# Patient Record
Sex: Female | Born: 1960 | Hispanic: No | Marital: Married | Smoking: Never smoker
Health system: Southern US, Community
[De-identification: ages and names within clinical notes are randomized; demographics above are authoritative.]

## PROBLEM LIST (undated history)

## (undated) DIAGNOSIS — M1990 Unspecified osteoarthritis, unspecified site: Secondary | ICD-10-CM

## (undated) DIAGNOSIS — G709 Myoneural disorder, unspecified: Secondary | ICD-10-CM

## (undated) HISTORY — PX: CHOLECYSTECTOMY: SHX55

## (undated) HISTORY — PX: COLONOSCOPY: SHX174

## (undated) HISTORY — DX: Unspecified osteoarthritis, unspecified site: M19.90

## (undated) HISTORY — PX: TUBAL LIGATION: SHX77

## (undated) HISTORY — DX: Myoneural disorder, unspecified: G70.9

---

## 2015-09-28 DIAGNOSIS — G8929 Other chronic pain: Secondary | ICD-10-CM | POA: Diagnosis not present

## 2015-09-28 DIAGNOSIS — M791 Myalgia: Secondary | ICD-10-CM | POA: Diagnosis not present

## 2015-12-22 DIAGNOSIS — Z0000 Encounter for general adult medical examination without abnormal findings: Secondary | ICD-10-CM | POA: Diagnosis not present

## 2015-12-22 DIAGNOSIS — M7552 Bursitis of left shoulder: Secondary | ICD-10-CM | POA: Diagnosis not present

## 2016-04-18 DIAGNOSIS — D225 Melanocytic nevi of trunk: Secondary | ICD-10-CM | POA: Diagnosis not present

## 2016-04-18 DIAGNOSIS — L570 Actinic keratosis: Secondary | ICD-10-CM | POA: Diagnosis not present

## 2016-04-18 DIAGNOSIS — Z1231 Encounter for screening mammogram for malignant neoplasm of breast: Secondary | ICD-10-CM | POA: Diagnosis not present

## 2016-04-18 DIAGNOSIS — L821 Other seborrheic keratosis: Secondary | ICD-10-CM | POA: Diagnosis not present

## 2016-04-18 DIAGNOSIS — C44612 Basal cell carcinoma of skin of right upper limb, including shoulder: Secondary | ICD-10-CM | POA: Diagnosis not present

## 2016-04-18 DIAGNOSIS — L820 Inflamed seborrheic keratosis: Secondary | ICD-10-CM | POA: Diagnosis not present

## 2016-04-18 DIAGNOSIS — D2239 Melanocytic nevi of other parts of face: Secondary | ICD-10-CM | POA: Diagnosis not present

## 2016-06-10 DIAGNOSIS — J329 Chronic sinusitis, unspecified: Secondary | ICD-10-CM | POA: Diagnosis not present

## 2016-06-10 DIAGNOSIS — Z20828 Contact with and (suspected) exposure to other viral communicable diseases: Secondary | ICD-10-CM | POA: Diagnosis not present

## 2016-06-10 DIAGNOSIS — J40 Bronchitis, not specified as acute or chronic: Secondary | ICD-10-CM | POA: Diagnosis not present

## 2016-06-10 DIAGNOSIS — M791 Myalgia: Secondary | ICD-10-CM | POA: Diagnosis not present

## 2016-07-18 DIAGNOSIS — J40 Bronchitis, not specified as acute or chronic: Secondary | ICD-10-CM | POA: Diagnosis not present

## 2016-07-18 DIAGNOSIS — M150 Primary generalized (osteo)arthritis: Secondary | ICD-10-CM | POA: Diagnosis not present

## 2016-07-18 DIAGNOSIS — Z6830 Body mass index (BMI) 30.0-30.9, adult: Secondary | ICD-10-CM | POA: Diagnosis not present

## 2016-07-18 DIAGNOSIS — M797 Fibromyalgia: Secondary | ICD-10-CM | POA: Diagnosis not present

## 2016-07-18 DIAGNOSIS — J329 Chronic sinusitis, unspecified: Secondary | ICD-10-CM | POA: Diagnosis not present

## 2016-07-18 DIAGNOSIS — M7989 Other specified soft tissue disorders: Secondary | ICD-10-CM | POA: Diagnosis not present

## 2016-08-08 DIAGNOSIS — M797 Fibromyalgia: Secondary | ICD-10-CM | POA: Diagnosis not present

## 2016-08-08 DIAGNOSIS — M150 Primary generalized (osteo)arthritis: Secondary | ICD-10-CM | POA: Diagnosis not present

## 2016-08-08 DIAGNOSIS — M7989 Other specified soft tissue disorders: Secondary | ICD-10-CM | POA: Diagnosis not present

## 2016-10-24 DIAGNOSIS — H81399 Other peripheral vertigo, unspecified ear: Secondary | ICD-10-CM | POA: Diagnosis not present

## 2016-12-06 DIAGNOSIS — Z01419 Encounter for gynecological examination (general) (routine) without abnormal findings: Secondary | ICD-10-CM | POA: Diagnosis not present

## 2016-12-06 DIAGNOSIS — Z1389 Encounter for screening for other disorder: Secondary | ICD-10-CM | POA: Diagnosis not present

## 2016-12-06 DIAGNOSIS — Z6831 Body mass index (BMI) 31.0-31.9, adult: Secondary | ICD-10-CM | POA: Diagnosis not present

## 2017-05-27 DIAGNOSIS — J40 Bronchitis, not specified as acute or chronic: Secondary | ICD-10-CM | POA: Diagnosis not present

## 2017-05-27 DIAGNOSIS — Z6833 Body mass index (BMI) 33.0-33.9, adult: Secondary | ICD-10-CM | POA: Diagnosis not present

## 2017-05-27 DIAGNOSIS — J329 Chronic sinusitis, unspecified: Secondary | ICD-10-CM | POA: Diagnosis not present

## 2017-11-06 DIAGNOSIS — L821 Other seborrheic keratosis: Secondary | ICD-10-CM | POA: Diagnosis not present

## 2017-11-06 DIAGNOSIS — Z0001 Encounter for general adult medical examination with abnormal findings: Secondary | ICD-10-CM | POA: Diagnosis not present

## 2017-11-06 DIAGNOSIS — L728 Other follicular cysts of the skin and subcutaneous tissue: Secondary | ICD-10-CM | POA: Diagnosis not present

## 2017-11-06 DIAGNOSIS — D225 Melanocytic nevi of trunk: Secondary | ICD-10-CM | POA: Diagnosis not present

## 2017-11-06 DIAGNOSIS — M797 Fibromyalgia: Secondary | ICD-10-CM | POA: Diagnosis not present

## 2017-11-06 DIAGNOSIS — D2239 Melanocytic nevi of other parts of face: Secondary | ICD-10-CM | POA: Diagnosis not present

## 2017-11-06 DIAGNOSIS — Z1339 Encounter for screening examination for other mental health and behavioral disorders: Secondary | ICD-10-CM | POA: Diagnosis not present

## 2017-11-06 DIAGNOSIS — Z1231 Encounter for screening mammogram for malignant neoplasm of breast: Secondary | ICD-10-CM | POA: Diagnosis not present

## 2017-11-06 DIAGNOSIS — L820 Inflamed seborrheic keratosis: Secondary | ICD-10-CM | POA: Diagnosis not present

## 2017-11-06 DIAGNOSIS — Z6833 Body mass index (BMI) 33.0-33.9, adult: Secondary | ICD-10-CM | POA: Diagnosis not present

## 2018-01-09 DIAGNOSIS — Z23 Encounter for immunization: Secondary | ICD-10-CM | POA: Diagnosis not present

## 2018-01-09 DIAGNOSIS — Z6834 Body mass index (BMI) 34.0-34.9, adult: Secondary | ICD-10-CM | POA: Diagnosis not present

## 2018-01-09 DIAGNOSIS — M5440 Lumbago with sciatica, unspecified side: Secondary | ICD-10-CM | POA: Diagnosis not present

## 2018-03-05 ENCOUNTER — Encounter: Payer: Self-pay | Admitting: Gastroenterology

## 2018-06-04 ENCOUNTER — Encounter: Payer: Self-pay | Admitting: Gastroenterology

## 2018-06-19 ENCOUNTER — Ambulatory Visit (AMBULATORY_SURGERY_CENTER): Payer: Self-pay

## 2018-06-19 ENCOUNTER — Encounter: Payer: Self-pay | Admitting: Gastroenterology

## 2018-06-19 VITALS — Ht 66.0 in | Wt 218.0 lb

## 2018-06-19 DIAGNOSIS — Z1211 Encounter for screening for malignant neoplasm of colon: Secondary | ICD-10-CM

## 2018-06-19 MED ORDER — NA SULFATE-K SULFATE-MG SULF 17.5-3.13-1.6 GM/177ML PO SOLN: 1.0000 | Freq: Once | ORAL | 0 refills | Status: AC

## 2018-06-19 NOTE — Progress Notes (Signed)
No egg or soy allergy known to patient  No issues with past sedation with any surgeries  or procedures, no intubation problems . N/V with ansthesia No diet pills per patient No home 02 use per patient  No blood thinners per patient  Pt denies issues with constipation  No A fib or A flutter  EMMI video sent to pt's e mail. Pt declined

## 2018-07-02 DIAGNOSIS — M7061 Trochanteric bursitis, right hip: Secondary | ICD-10-CM | POA: Diagnosis not present

## 2018-07-02 DIAGNOSIS — M5440 Lumbago with sciatica, unspecified side: Secondary | ICD-10-CM | POA: Diagnosis not present

## 2018-07-02 DIAGNOSIS — Z6834 Body mass index (BMI) 34.0-34.9, adult: Secondary | ICD-10-CM | POA: Diagnosis not present

## 2018-07-03 ENCOUNTER — Encounter: Payer: Self-pay | Admitting: Gastroenterology

## 2018-07-03 ENCOUNTER — Ambulatory Visit (AMBULATORY_SURGERY_CENTER): Payer: BLUE CROSS/BLUE SHIELD | Admitting: Gastroenterology

## 2018-07-03 VITALS — BP 145/67 | HR 67 | Temp 98.0°F | Resp 20 | Ht 66.0 in | Wt 218.0 lb

## 2018-07-03 DIAGNOSIS — Z1211 Encounter for screening for malignant neoplasm of colon: Secondary | ICD-10-CM | POA: Diagnosis not present

## 2018-07-03 DIAGNOSIS — D127 Benign neoplasm of rectosigmoid junction: Secondary | ICD-10-CM | POA: Diagnosis not present

## 2018-07-03 DIAGNOSIS — Z800 Family history of malignant neoplasm of digestive organs: Secondary | ICD-10-CM | POA: Diagnosis not present

## 2018-07-03 MED ORDER — SODIUM CHLORIDE 0.9 % IV SOLN: 500.0000 mL | Freq: Once | INTRAVENOUS | Status: DC

## 2018-07-03 NOTE — Progress Notes (Signed)
Pt reports that she had a cortisone injection for her back by Dr. Humphrey Rolls yesterday. She reports no back pain at this time. Sm

## 2018-07-03 NOTE — Progress Notes (Signed)
Called to room to assist during endoscopic procedure.  Patient ID and intended procedure confirmed with present staff. Received instructions for my participation in the procedure from the performing physician.  

## 2018-07-03 NOTE — Patient Instructions (Signed)
YOU HAD AN ENDOSCOPIC PROCEDURE TODAY AT THE [facility]:   Refer to the procedure report that was given to you for any specific questions about what was found during the examination.  If the procedure report does not answer your questions, please call your gastroenterologist to clarify.  If you requested that your care partner not be given the details of your procedure findings, then the procedure report has been included in a sealed envelope for you to review at your convenience later.  YOU SHOULD EXPECT: Some feelings of bloating in the abdomen. Passage of more gas than usual.  Walking can help get rid of the air that was put into your GI tract during the procedure and reduce the bloating. If you had a lower endoscopy (such as a colonoscopy or flexible sigmoidoscopy) you may notice spotting of blood in your stool or on the toilet paper. If you underwent a bowel prep for your procedure, you may not have a normal bowel movement for a few days.  Please Note:  You might notice some irritation and congestion in your nose or some drainage.  This is from the oxygen used during your procedure.  There is no need for concern and it should clear up in a day or so.  SYMPTOMS TO REPORT IMMEDIATELY:   Following lower endoscopy (colonoscopy or flexible sigmoidoscopy):  Excessive amounts of blood in the stool  Significant tenderness or worsening of abdominal pains  Swelling of the abdomen that is new, acute  Fever of 100F or higher    For urgent or emergent issues, a gastroenterologist can be reached at any hour by calling [phone].   DIET:  We do recommend a small meal at first, but then you may proceed to your regular diet.  Drink plenty of fluids but you should avoid alcoholic beverages for 24 hours.  ACTIVITY:  You should plan to take it easy for the rest of today and you should NOT DRIVE or use heavy machinery until tomorrow (because of the sedation medicines used during the test).     FOLLOW UP: Our staff will call the number listed on your records the next business day following your procedure to check on you and address any questions or concerns that you may have regarding the information given to you following your procedure. If we do not reach you, we will leave a message.  However, if you are feeling well and you are not experiencing any problems, there is no need to return our call.  We will assume that you have returned to your regular daily activities without incident.  If any biopsies were taken you will be contacted by phone or by letter within the next 1-3 weeks.  Please call us at [phone] if you have not heard about the biopsies in 3 weeks.    SIGNATURES/CONFIDENTIALITY: You and/or your care partner have signed paperwork which will be entered into your electronic medical record.  These signatures attest to the fact that that the information above on your After Visit Summary has been reviewed and is understood.  Full responsibility of the confidentiality of this discharge information lies with you and/or your care-partner.  Polyp and diverticulosis information given. 

## 2018-07-03 NOTE — Op Note (Signed)
[facility] Patient Name: Elizabeth Guerrero Procedure Date: 07/03/2018 9:18 AM MRN: [id number] Endoscopist: Jackquline Denmark , MD Age: 58 Referring MD:  Date of Birth: 11/03/1960 Gender: Female Account #: [id number] Procedure:                Colonoscopy Indications:              Screening for colorectal malignant neoplasm. Family                            history of colonic polyps. Medicines:                Monitored Anesthesia Care Procedure:                Pre-Anesthesia Assessment:                           - Prior to the procedure, a History and Physical                            was performed, and patient medications and                            allergies were reviewed. The patient's tolerance of                            previous anesthesia was also reviewed. The risks                            and benefits of the procedure and the sedation                            options and risks were discussed with the patient.                            All questions were answered, and informed consent                            was obtained. Prior Anticoagulants: The patient has                            taken no previous anticoagulant or antiplatelet                            agents. ASA Grade Assessment: I - A normal, healthy                            patient. After reviewing the risks and benefits,                            the patient was deemed in satisfactory condition to                            undergo the procedure.  After obtaining informed consent, the colonoscope                            was passed under direct vision. Throughout the                            procedure, the patient's blood pressure, pulse, and                            oxygen saturations were monitored continuously. The                            Colonoscope was introduced through the anus and                            advanced to the 2 cm into the ileum. The                      colonoscopy was performed without difficulty. The                            patient tolerated the procedure well. The quality                            of the bowel preparation was excellent. The                            terminal ileum, ileocecal valve, appendiceal                            orifice, and rectum were photographed. Scope In: 9:30:06 AM Scope Out: 9:45:23 AM Scope Withdrawal Time: 0 hours 11 minutes 30 seconds  Total Procedure Duration: 0 hours 15 minutes 17 seconds  Findings:                 A 6 mm polyp was found in the recto-sigmoid colon.                            The polyp was sessile. The polyp was removed with a                            cold snare. Resection and retrieval were complete.                            Estimated blood loss: none.                           A few small-mouthed diverticula were found in the                            sigmoid colon.                           Non-bleeding internal hemorrhoids were found during  retroflexion. The hemorrhoids were small.                           The exam was otherwise without abnormality on                            direct and retroflexion views. Complications:            No immediate complications. Estimated Blood Loss:     Estimated blood loss: none. Impression:               -Small colonic polyp status post polypectomy.                           -Mild sigmoid diverticulosis.                           -Otherwise normal colonoscopy to TI. Recommendation:           - Patient has a contact number available for                            emergencies. The signs and symptoms of potential                            delayed complications were discussed with the                            patient. Return to normal activities tomorrow.                            Written discharge instructions were provided to the                            patient.                            - Resume previous diet.                           - Continue present medications.                           - Await pathology results.                           - Repeat colonoscopy for surveillance based on                            pathology results.                           - Return to GI office PRN. Jackquline Denmark, MD 07/03/2018 9:50:52 AM This report has been signed electronically.

## 2018-07-03 NOTE — Progress Notes (Signed)
A/ox3, pleased with MAC, report to RN 

## 2018-07-06 ENCOUNTER — Telehealth: Payer: Self-pay

## 2018-07-06 NOTE — Telephone Encounter (Signed)
  Follow up Call-  Call back number 07/03/2018  Post procedure Call Back phone  # [phone]  Permission to leave phone message Yes  Some recent data might be hidden     Patient questions:  Do you have a fever, pain , or abdominal swelling? No. Pain Score  0 *  Have you tolerated food without any problems? Yes.    Have you been able to return to your normal activities? Yes.    Do you have any questions about your discharge instructions: Diet   No. Medications  No. Follow up visit  No.  Do you have questions or concerns about your Care? No.  Actions: * If pain score is 4 or above: No action needed, pain <4.

## 2018-07-08 DIAGNOSIS — M5440 Lumbago with sciatica, unspecified side: Secondary | ICD-10-CM | POA: Diagnosis not present

## 2018-07-08 DIAGNOSIS — M48061 Spinal stenosis, lumbar region without neurogenic claudication: Secondary | ICD-10-CM | POA: Diagnosis not present

## 2018-07-08 DIAGNOSIS — M5126 Other intervertebral disc displacement, lumbar region: Secondary | ICD-10-CM | POA: Diagnosis not present

## 2018-07-09 ENCOUNTER — Encounter: Payer: Self-pay | Admitting: Gastroenterology

## 2018-07-09 DIAGNOSIS — Z6833 Body mass index (BMI) 33.0-33.9, adult: Secondary | ICD-10-CM | POA: Diagnosis not present

## 2018-07-09 DIAGNOSIS — J111 Influenza due to unidentified influenza virus with other respiratory manifestations: Secondary | ICD-10-CM | POA: Diagnosis not present

## 2018-12-03 DIAGNOSIS — L578 Other skin changes due to chronic exposure to nonionizing radiation: Secondary | ICD-10-CM | POA: Diagnosis not present

## 2018-12-03 DIAGNOSIS — D2239 Melanocytic nevi of other parts of face: Secondary | ICD-10-CM | POA: Diagnosis not present

## 2018-12-03 DIAGNOSIS — L719 Rosacea, unspecified: Secondary | ICD-10-CM | POA: Diagnosis not present

## 2018-12-03 DIAGNOSIS — D225 Melanocytic nevi of trunk: Secondary | ICD-10-CM | POA: Diagnosis not present

## 2018-12-03 DIAGNOSIS — L820 Inflamed seborrheic keratosis: Secondary | ICD-10-CM | POA: Diagnosis not present

## 2018-12-10 DIAGNOSIS — M797 Fibromyalgia: Secondary | ICD-10-CM | POA: Diagnosis not present

## 2018-12-10 DIAGNOSIS — Z6834 Body mass index (BMI) 34.0-34.9, adult: Secondary | ICD-10-CM | POA: Diagnosis not present

## 2018-12-10 DIAGNOSIS — Z13220 Encounter for screening for lipoid disorders: Secondary | ICD-10-CM | POA: Diagnosis not present

## 2018-12-10 DIAGNOSIS — Z0001 Encounter for general adult medical examination with abnormal findings: Secondary | ICD-10-CM | POA: Diagnosis not present

## 2018-12-10 DIAGNOSIS — E669 Obesity, unspecified: Secondary | ICD-10-CM | POA: Diagnosis not present

## 2019-01-22 DIAGNOSIS — Z6832 Body mass index (BMI) 32.0-32.9, adult: Secondary | ICD-10-CM | POA: Diagnosis not present

## 2019-01-22 DIAGNOSIS — E119 Type 2 diabetes mellitus without complications: Secondary | ICD-10-CM | POA: Diagnosis not present

## 2019-01-28 DIAGNOSIS — L719 Rosacea, unspecified: Secondary | ICD-10-CM | POA: Diagnosis not present

## 2019-01-28 DIAGNOSIS — B351 Tinea unguium: Secondary | ICD-10-CM | POA: Diagnosis not present

## 2019-01-28 DIAGNOSIS — L820 Inflamed seborrheic keratosis: Secondary | ICD-10-CM | POA: Diagnosis not present

## 2019-01-29 DIAGNOSIS — Z1231 Encounter for screening mammogram for malignant neoplasm of breast: Secondary | ICD-10-CM | POA: Diagnosis not present

## 2019-03-05 DIAGNOSIS — M2550 Pain in unspecified joint: Secondary | ICD-10-CM | POA: Diagnosis not present

## 2019-03-05 DIAGNOSIS — M7918 Myalgia, other site: Secondary | ICD-10-CM | POA: Diagnosis not present

## 2019-03-05 DIAGNOSIS — M797 Fibromyalgia: Secondary | ICD-10-CM | POA: Diagnosis not present

## 2019-03-19 DIAGNOSIS — E119 Type 2 diabetes mellitus without complications: Secondary | ICD-10-CM | POA: Diagnosis not present

## 2019-04-20 DIAGNOSIS — M797 Fibromyalgia: Secondary | ICD-10-CM | POA: Diagnosis not present

## 2019-04-21 DIAGNOSIS — F331 Major depressive disorder, recurrent, moderate: Secondary | ICD-10-CM | POA: Diagnosis not present

## 2019-05-06 DIAGNOSIS — R531 Weakness: Secondary | ICD-10-CM | POA: Diagnosis not present

## 2019-05-06 DIAGNOSIS — L650 Telogen effluvium: Secondary | ICD-10-CM | POA: Diagnosis not present

## 2019-05-06 DIAGNOSIS — B351 Tinea unguium: Secondary | ICD-10-CM | POA: Diagnosis not present

## 2019-05-06 DIAGNOSIS — L719 Rosacea, unspecified: Secondary | ICD-10-CM | POA: Diagnosis not present

## 2019-05-13 DIAGNOSIS — M797 Fibromyalgia: Secondary | ICD-10-CM | POA: Diagnosis not present

## 2019-06-01 DIAGNOSIS — Z20828 Contact with and (suspected) exposure to other viral communicable diseases: Secondary | ICD-10-CM | POA: Diagnosis not present

## 2019-06-01 DIAGNOSIS — R509 Fever, unspecified: Secondary | ICD-10-CM | POA: Diagnosis not present

## 2019-06-09 DIAGNOSIS — B09 Unspecified viral infection characterized by skin and mucous membrane lesions: Secondary | ICD-10-CM | POA: Diagnosis not present

## 2019-08-05 DIAGNOSIS — M797 Fibromyalgia: Secondary | ICD-10-CM | POA: Diagnosis not present

## 2019-08-05 DIAGNOSIS — E119 Type 2 diabetes mellitus without complications: Secondary | ICD-10-CM | POA: Diagnosis not present

## 2019-08-05 DIAGNOSIS — Z79899 Other long term (current) drug therapy: Secondary | ICD-10-CM | POA: Diagnosis not present

## 2019-08-05 DIAGNOSIS — Z6832 Body mass index (BMI) 32.0-32.9, adult: Secondary | ICD-10-CM | POA: Diagnosis not present

## 2019-11-11 DIAGNOSIS — B351 Tinea unguium: Secondary | ICD-10-CM | POA: Diagnosis not present

## 2019-11-11 DIAGNOSIS — L650 Telogen effluvium: Secondary | ICD-10-CM | POA: Diagnosis not present

## 2019-11-11 DIAGNOSIS — L570 Actinic keratosis: Secondary | ICD-10-CM | POA: Diagnosis not present

## 2019-11-11 DIAGNOSIS — L719 Rosacea, unspecified: Secondary | ICD-10-CM | POA: Diagnosis not present

## 2019-11-26 DIAGNOSIS — Z0001 Encounter for general adult medical examination with abnormal findings: Secondary | ICD-10-CM | POA: Diagnosis not present

## 2019-11-26 DIAGNOSIS — M797 Fibromyalgia: Secondary | ICD-10-CM | POA: Diagnosis not present

## 2019-11-26 DIAGNOSIS — E119 Type 2 diabetes mellitus without complications: Secondary | ICD-10-CM | POA: Diagnosis not present

## 2019-11-26 DIAGNOSIS — Z131 Encounter for screening for diabetes mellitus: Secondary | ICD-10-CM | POA: Diagnosis not present

## 2019-11-26 DIAGNOSIS — R7989 Other specified abnormal findings of blood chemistry: Secondary | ICD-10-CM | POA: Diagnosis not present

## 2019-11-26 DIAGNOSIS — Z13220 Encounter for screening for lipoid disorders: Secondary | ICD-10-CM | POA: Diagnosis not present

## 2019-11-29 DIAGNOSIS — Z79899 Other long term (current) drug therapy: Secondary | ICD-10-CM | POA: Diagnosis not present

## 2019-12-06 DIAGNOSIS — Z6833 Body mass index (BMI) 33.0-33.9, adult: Secondary | ICD-10-CM | POA: Diagnosis not present

## 2019-12-06 DIAGNOSIS — H9202 Otalgia, left ear: Secondary | ICD-10-CM | POA: Diagnosis not present

## 2019-12-06 DIAGNOSIS — E1165 Type 2 diabetes mellitus with hyperglycemia: Secondary | ICD-10-CM | POA: Diagnosis not present

## 2020-02-28 DIAGNOSIS — Z23 Encounter for immunization: Secondary | ICD-10-CM | POA: Diagnosis not present

## 2020-02-28 DIAGNOSIS — L299 Pruritus, unspecified: Secondary | ICD-10-CM | POA: Diagnosis not present

## 2020-02-28 DIAGNOSIS — M25511 Pain in right shoulder: Secondary | ICD-10-CM | POA: Diagnosis not present

## 2020-02-28 DIAGNOSIS — Z79899 Other long term (current) drug therapy: Secondary | ICD-10-CM | POA: Diagnosis not present

## 2020-02-28 DIAGNOSIS — M25512 Pain in left shoulder: Secondary | ICD-10-CM | POA: Diagnosis not present

## 2020-02-28 DIAGNOSIS — E1165 Type 2 diabetes mellitus with hyperglycemia: Secondary | ICD-10-CM | POA: Diagnosis not present

## 2020-03-07 DIAGNOSIS — M7540 Impingement syndrome of unspecified shoulder: Secondary | ICD-10-CM | POA: Diagnosis not present

## 2020-04-12 DIAGNOSIS — D225 Melanocytic nevi of trunk: Secondary | ICD-10-CM | POA: Diagnosis not present

## 2020-04-12 DIAGNOSIS — L814 Other melanin hyperpigmentation: Secondary | ICD-10-CM | POA: Diagnosis not present

## 2020-04-12 DIAGNOSIS — D2239 Melanocytic nevi of other parts of face: Secondary | ICD-10-CM | POA: Diagnosis not present

## 2020-04-12 DIAGNOSIS — D1801 Hemangioma of skin and subcutaneous tissue: Secondary | ICD-10-CM | POA: Diagnosis not present

## 2020-05-01 DIAGNOSIS — N950 Postmenopausal bleeding: Secondary | ICD-10-CM | POA: Diagnosis not present

## 2020-05-01 DIAGNOSIS — Z6833 Body mass index (BMI) 33.0-33.9, adult: Secondary | ICD-10-CM | POA: Diagnosis not present

## 2020-05-01 DIAGNOSIS — Z124 Encounter for screening for malignant neoplasm of cervix: Secondary | ICD-10-CM | POA: Diagnosis not present

## 2020-05-02 DIAGNOSIS — Z124 Encounter for screening for malignant neoplasm of cervix: Secondary | ICD-10-CM | POA: Diagnosis not present

## 2020-05-08 DIAGNOSIS — N950 Postmenopausal bleeding: Secondary | ICD-10-CM | POA: Diagnosis not present

## 2020-05-08 DIAGNOSIS — N83201 Unspecified ovarian cyst, right side: Secondary | ICD-10-CM | POA: Diagnosis not present

## 2020-05-08 DIAGNOSIS — N939 Abnormal uterine and vaginal bleeding, unspecified: Secondary | ICD-10-CM | POA: Diagnosis not present

## 2020-05-08 DIAGNOSIS — N858 Other specified noninflammatory disorders of uterus: Secondary | ICD-10-CM | POA: Diagnosis not present

## 2020-06-15 DIAGNOSIS — Z1231 Encounter for screening mammogram for malignant neoplasm of breast: Secondary | ICD-10-CM | POA: Diagnosis not present

## 2020-06-22 DIAGNOSIS — M542 Cervicalgia: Secondary | ICD-10-CM | POA: Diagnosis not present

## 2020-06-22 DIAGNOSIS — M79601 Pain in right arm: Secondary | ICD-10-CM | POA: Diagnosis not present

## 2020-06-22 DIAGNOSIS — M7540 Impingement syndrome of unspecified shoulder: Secondary | ICD-10-CM | POA: Diagnosis not present

## 2020-06-22 DIAGNOSIS — M25511 Pain in right shoulder: Secondary | ICD-10-CM | POA: Diagnosis not present

## 2020-06-29 DIAGNOSIS — F331 Major depressive disorder, recurrent, moderate: Secondary | ICD-10-CM | POA: Diagnosis not present

## 2020-06-29 DIAGNOSIS — Z79899 Other long term (current) drug therapy: Secondary | ICD-10-CM | POA: Diagnosis not present

## 2020-08-10 DIAGNOSIS — F331 Major depressive disorder, recurrent, moderate: Secondary | ICD-10-CM | POA: Diagnosis not present

## 2020-09-11 DIAGNOSIS — Z6833 Body mass index (BMI) 33.0-33.9, adult: Secondary | ICD-10-CM | POA: Diagnosis not present

## 2020-09-11 DIAGNOSIS — M797 Fibromyalgia: Secondary | ICD-10-CM | POA: Diagnosis not present

## 2020-09-11 DIAGNOSIS — E7800 Pure hypercholesterolemia, unspecified: Secondary | ICD-10-CM | POA: Diagnosis not present

## 2020-09-11 DIAGNOSIS — R413 Other amnesia: Secondary | ICD-10-CM | POA: Diagnosis not present

## 2020-09-11 DIAGNOSIS — E538 Deficiency of other specified B group vitamins: Secondary | ICD-10-CM | POA: Diagnosis not present

## 2020-09-17 DIAGNOSIS — R051 Acute cough: Secondary | ICD-10-CM | POA: Diagnosis not present

## 2020-09-17 DIAGNOSIS — Z20828 Contact with and (suspected) exposure to other viral communicable diseases: Secondary | ICD-10-CM | POA: Diagnosis not present

## 2020-09-19 DIAGNOSIS — M542 Cervicalgia: Secondary | ICD-10-CM | POA: Diagnosis not present

## 2020-09-19 DIAGNOSIS — M79601 Pain in right arm: Secondary | ICD-10-CM | POA: Diagnosis not present

## 2020-09-19 DIAGNOSIS — M7540 Impingement syndrome of unspecified shoulder: Secondary | ICD-10-CM | POA: Diagnosis not present

## 2020-09-19 DIAGNOSIS — M25511 Pain in right shoulder: Secondary | ICD-10-CM | POA: Diagnosis not present

## 2020-09-20 DIAGNOSIS — M25511 Pain in right shoulder: Secondary | ICD-10-CM | POA: Diagnosis not present

## 2020-09-21 DIAGNOSIS — F331 Major depressive disorder, recurrent, moderate: Secondary | ICD-10-CM | POA: Diagnosis not present

## 2020-11-08 DIAGNOSIS — F331 Major depressive disorder, recurrent, moderate: Secondary | ICD-10-CM | POA: Diagnosis not present

## 2020-11-29 DIAGNOSIS — Z0001 Encounter for general adult medical examination with abnormal findings: Secondary | ICD-10-CM | POA: Diagnosis not present

## 2020-11-29 DIAGNOSIS — Z13220 Encounter for screening for lipoid disorders: Secondary | ICD-10-CM | POA: Diagnosis not present

## 2020-11-29 DIAGNOSIS — Z6833 Body mass index (BMI) 33.0-33.9, adult: Secondary | ICD-10-CM | POA: Diagnosis not present

## 2020-11-29 DIAGNOSIS — Z1331 Encounter for screening for depression: Secondary | ICD-10-CM | POA: Diagnosis not present

## 2020-11-29 DIAGNOSIS — M797 Fibromyalgia: Secondary | ICD-10-CM | POA: Diagnosis not present

## 2020-12-21 DIAGNOSIS — M7541 Impingement syndrome of right shoulder: Secondary | ICD-10-CM | POA: Diagnosis not present

## 2020-12-27 DIAGNOSIS — F331 Major depressive disorder, recurrent, moderate: Secondary | ICD-10-CM | POA: Diagnosis not present

## 2021-02-07 DIAGNOSIS — F331 Major depressive disorder, recurrent, moderate: Secondary | ICD-10-CM | POA: Diagnosis not present

## 2021-03-12 DIAGNOSIS — F332 Major depressive disorder, recurrent severe without psychotic features: Secondary | ICD-10-CM | POA: Diagnosis not present

## 2021-03-14 DIAGNOSIS — F332 Major depressive disorder, recurrent severe without psychotic features: Secondary | ICD-10-CM | POA: Diagnosis not present

## 2021-03-19 DIAGNOSIS — F332 Major depressive disorder, recurrent severe without psychotic features: Secondary | ICD-10-CM | POA: Diagnosis not present

## 2021-03-21 DIAGNOSIS — F332 Major depressive disorder, recurrent severe without psychotic features: Secondary | ICD-10-CM | POA: Diagnosis not present

## 2021-03-26 DIAGNOSIS — F332 Major depressive disorder, recurrent severe without psychotic features: Secondary | ICD-10-CM | POA: Diagnosis not present

## 2021-03-28 DIAGNOSIS — F332 Major depressive disorder, recurrent severe without psychotic features: Secondary | ICD-10-CM | POA: Diagnosis not present

## 2021-04-24 DIAGNOSIS — L570 Actinic keratosis: Secondary | ICD-10-CM | POA: Diagnosis not present

## 2021-04-24 DIAGNOSIS — L821 Other seborrheic keratosis: Secondary | ICD-10-CM | POA: Diagnosis not present

## 2021-04-24 DIAGNOSIS — D485 Neoplasm of uncertain behavior of skin: Secondary | ICD-10-CM | POA: Diagnosis not present

## 2021-04-24 DIAGNOSIS — D225 Melanocytic nevi of trunk: Secondary | ICD-10-CM | POA: Diagnosis not present

## 2021-04-24 DIAGNOSIS — D2239 Melanocytic nevi of other parts of face: Secondary | ICD-10-CM | POA: Diagnosis not present

## 2021-04-24 DIAGNOSIS — L814 Other melanin hyperpigmentation: Secondary | ICD-10-CM | POA: Diagnosis not present

## 2021-05-10 DIAGNOSIS — Z23 Encounter for immunization: Secondary | ICD-10-CM | POA: Diagnosis not present

## 2021-05-10 DIAGNOSIS — R413 Other amnesia: Secondary | ICD-10-CM | POA: Diagnosis not present

## 2021-05-10 DIAGNOSIS — E538 Deficiency of other specified B group vitamins: Secondary | ICD-10-CM | POA: Diagnosis not present

## 2021-05-10 DIAGNOSIS — M797 Fibromyalgia: Secondary | ICD-10-CM | POA: Diagnosis not present

## 2021-05-10 DIAGNOSIS — E1169 Type 2 diabetes mellitus with other specified complication: Secondary | ICD-10-CM | POA: Diagnosis not present

## 2021-05-21 DIAGNOSIS — F332 Major depressive disorder, recurrent severe without psychotic features: Secondary | ICD-10-CM | POA: Diagnosis not present

## 2021-05-22 DIAGNOSIS — E538 Deficiency of other specified B group vitamins: Secondary | ICD-10-CM | POA: Diagnosis not present

## 2021-05-22 DIAGNOSIS — M7551 Bursitis of right shoulder: Secondary | ICD-10-CM | POA: Diagnosis not present

## 2021-05-23 DIAGNOSIS — F332 Major depressive disorder, recurrent severe without psychotic features: Secondary | ICD-10-CM | POA: Diagnosis not present

## 2021-05-28 DIAGNOSIS — F332 Major depressive disorder, recurrent severe without psychotic features: Secondary | ICD-10-CM | POA: Diagnosis not present

## 2021-06-04 DIAGNOSIS — F332 Major depressive disorder, recurrent severe without psychotic features: Secondary | ICD-10-CM | POA: Diagnosis not present

## 2021-06-11 DIAGNOSIS — F332 Major depressive disorder, recurrent severe without psychotic features: Secondary | ICD-10-CM | POA: Diagnosis not present

## 2021-06-14 DIAGNOSIS — F331 Major depressive disorder, recurrent, moderate: Secondary | ICD-10-CM | POA: Diagnosis not present

## 2021-06-25 DIAGNOSIS — F332 Major depressive disorder, recurrent severe without psychotic features: Secondary | ICD-10-CM | POA: Diagnosis not present

## 2021-07-02 DIAGNOSIS — F332 Major depressive disorder, recurrent severe without psychotic features: Secondary | ICD-10-CM | POA: Diagnosis not present

## 2021-07-09 DIAGNOSIS — F332 Major depressive disorder, recurrent severe without psychotic features: Secondary | ICD-10-CM | POA: Diagnosis not present

## 2021-07-11 DIAGNOSIS — F331 Major depressive disorder, recurrent, moderate: Secondary | ICD-10-CM | POA: Diagnosis not present

## 2021-07-30 DIAGNOSIS — F332 Major depressive disorder, recurrent severe without psychotic features: Secondary | ICD-10-CM | POA: Diagnosis not present

## 2021-09-06 DIAGNOSIS — F331 Major depressive disorder, recurrent, moderate: Secondary | ICD-10-CM | POA: Diagnosis not present

## 2021-09-07 ENCOUNTER — Other Ambulatory Visit: Payer: Self-pay | Admitting: Family Medicine

## 2021-09-07 DIAGNOSIS — R42 Dizziness and giddiness: Secondary | ICD-10-CM

## 2021-09-10 DIAGNOSIS — F332 Major depressive disorder, recurrent severe without psychotic features: Secondary | ICD-10-CM | POA: Diagnosis not present

## 2021-09-29 ENCOUNTER — Ambulatory Visit
Admission: RE | Admit: 2021-09-29 | Discharge: 2021-09-29 | Disposition: A | Discharge status: Home / Self Care | Payer: BC Managed Care – PPO | Source: Ambulatory Visit | Attending: Family Medicine | Admitting: Family Medicine

## 2021-09-29 DIAGNOSIS — R42 Dizziness and giddiness: Secondary | ICD-10-CM

## 2021-11-29 DIAGNOSIS — F331 Major depressive disorder, recurrent, moderate: Secondary | ICD-10-CM | POA: Diagnosis not present

## 2021-12-03 DIAGNOSIS — Z1331 Encounter for screening for depression: Secondary | ICD-10-CM | POA: Diagnosis not present

## 2021-12-03 DIAGNOSIS — Z0001 Encounter for general adult medical examination with abnormal findings: Secondary | ICD-10-CM | POA: Diagnosis not present

## 2021-12-03 DIAGNOSIS — E1169 Type 2 diabetes mellitus with other specified complication: Secondary | ICD-10-CM | POA: Diagnosis not present

## 2021-12-03 DIAGNOSIS — Z6831 Body mass index (BMI) 31.0-31.9, adult: Secondary | ICD-10-CM | POA: Diagnosis not present

## 2021-12-04 DIAGNOSIS — E1169 Type 2 diabetes mellitus with other specified complication: Secondary | ICD-10-CM | POA: Diagnosis not present

## 2022-03-19 DIAGNOSIS — Z6829 Body mass index (BMI) 29.0-29.9, adult: Secondary | ICD-10-CM | POA: Diagnosis not present

## 2022-03-19 DIAGNOSIS — J0120 Acute ethmoidal sinusitis, unspecified: Secondary | ICD-10-CM | POA: Diagnosis not present

## 2022-04-02 DIAGNOSIS — C44519 Basal cell carcinoma of skin of other part of trunk: Secondary | ICD-10-CM | POA: Diagnosis not present

## 2022-04-02 DIAGNOSIS — D225 Melanocytic nevi of trunk: Secondary | ICD-10-CM | POA: Diagnosis not present

## 2022-04-02 DIAGNOSIS — L821 Other seborrheic keratosis: Secondary | ICD-10-CM | POA: Diagnosis not present

## 2022-04-02 DIAGNOSIS — L578 Other skin changes due to chronic exposure to nonionizing radiation: Secondary | ICD-10-CM | POA: Diagnosis not present

## 2022-04-02 DIAGNOSIS — L853 Xerosis cutis: Secondary | ICD-10-CM | POA: Diagnosis not present

## 2022-04-02 DIAGNOSIS — L570 Actinic keratosis: Secondary | ICD-10-CM | POA: Diagnosis not present

## 2022-04-02 DIAGNOSIS — C44311 Basal cell carcinoma of skin of nose: Secondary | ICD-10-CM | POA: Diagnosis not present

## 2022-04-10 DIAGNOSIS — C44311 Basal cell carcinoma of skin of nose: Secondary | ICD-10-CM | POA: Diagnosis not present

## 2022-12-17 DIAGNOSIS — E538 Deficiency of other specified B group vitamins: Secondary | ICD-10-CM | POA: Diagnosis not present

## 2022-12-17 DIAGNOSIS — Z1331 Encounter for screening for depression: Secondary | ICD-10-CM | POA: Diagnosis not present

## 2022-12-17 DIAGNOSIS — E1169 Type 2 diabetes mellitus with other specified complication: Secondary | ICD-10-CM | POA: Diagnosis not present

## 2022-12-17 DIAGNOSIS — Z0001 Encounter for general adult medical examination with abnormal findings: Secondary | ICD-10-CM | POA: Diagnosis not present

## 2022-12-18 DIAGNOSIS — E538 Deficiency of other specified B group vitamins: Secondary | ICD-10-CM | POA: Diagnosis not present

## 2022-12-18 DIAGNOSIS — E1169 Type 2 diabetes mellitus with other specified complication: Secondary | ICD-10-CM | POA: Diagnosis not present

## 2022-12-20 DIAGNOSIS — E1169 Type 2 diabetes mellitus with other specified complication: Secondary | ICD-10-CM | POA: Diagnosis not present

## 2023-01-30 DIAGNOSIS — H6122 Impacted cerumen, left ear: Secondary | ICD-10-CM | POA: Diagnosis not present

## 2023-01-30 DIAGNOSIS — H903 Sensorineural hearing loss, bilateral: Secondary | ICD-10-CM | POA: Diagnosis not present

## 2023-01-31 DIAGNOSIS — Z1231 Encounter for screening mammogram for malignant neoplasm of breast: Secondary | ICD-10-CM | POA: Diagnosis not present

## 2023-04-09 DIAGNOSIS — B079 Viral wart, unspecified: Secondary | ICD-10-CM | POA: Diagnosis not present

## 2023-04-09 DIAGNOSIS — L821 Other seborrheic keratosis: Secondary | ICD-10-CM | POA: Diagnosis not present

## 2023-04-09 DIAGNOSIS — L570 Actinic keratosis: Secondary | ICD-10-CM | POA: Diagnosis not present

## 2023-04-09 DIAGNOSIS — L814 Other melanin hyperpigmentation: Secondary | ICD-10-CM | POA: Diagnosis not present

## 2023-04-09 DIAGNOSIS — L578 Other skin changes due to chronic exposure to nonionizing radiation: Secondary | ICD-10-CM | POA: Diagnosis not present

## 2023-04-09 DIAGNOSIS — D225 Melanocytic nevi of trunk: Secondary | ICD-10-CM | POA: Diagnosis not present

## 2023-06-03 DIAGNOSIS — S42302A Unspecified fracture of shaft of humerus, left arm, initial encounter for closed fracture: Secondary | ICD-10-CM | POA: Diagnosis not present

## 2023-06-04 DIAGNOSIS — E538 Deficiency of other specified B group vitamins: Secondary | ICD-10-CM | POA: Diagnosis not present

## 2023-06-04 DIAGNOSIS — E1169 Type 2 diabetes mellitus with other specified complication: Secondary | ICD-10-CM | POA: Diagnosis not present

## 2023-06-04 DIAGNOSIS — Z23 Encounter for immunization: Secondary | ICD-10-CM | POA: Diagnosis not present

## 2023-06-04 DIAGNOSIS — Z79899 Other long term (current) drug therapy: Secondary | ICD-10-CM | POA: Diagnosis not present

## 2023-06-04 DIAGNOSIS — E782 Mixed hyperlipidemia: Secondary | ICD-10-CM | POA: Diagnosis not present

## 2023-06-04 DIAGNOSIS — S42309A Unspecified fracture of shaft of humerus, unspecified arm, initial encounter for closed fracture: Secondary | ICD-10-CM | POA: Diagnosis not present

## 2023-06-12 DIAGNOSIS — S42302A Unspecified fracture of shaft of humerus, left arm, initial encounter for closed fracture: Secondary | ICD-10-CM | POA: Diagnosis not present

## 2023-06-14 DIAGNOSIS — K5903 Drug induced constipation: Secondary | ICD-10-CM | POA: Diagnosis not present

## 2023-06-14 DIAGNOSIS — T40605A Adverse effect of unspecified narcotics, initial encounter: Secondary | ICD-10-CM | POA: Diagnosis not present

## 2023-06-14 DIAGNOSIS — K5900 Constipation, unspecified: Secondary | ICD-10-CM | POA: Diagnosis not present

## 2023-06-17 DIAGNOSIS — S42202A Unspecified fracture of upper end of left humerus, initial encounter for closed fracture: Secondary | ICD-10-CM | POA: Diagnosis not present

## 2023-06-27 DIAGNOSIS — M84322A Stress fracture, left humerus, initial encounter for fracture: Secondary | ICD-10-CM | POA: Diagnosis not present

## 2023-07-02 DIAGNOSIS — S42202A Unspecified fracture of upper end of left humerus, initial encounter for closed fracture: Secondary | ICD-10-CM | POA: Diagnosis not present

## 2023-09-03 IMAGING — MR MR HEAD W/O CM
7 series · 48 of 48 positions shown · non-contrast
Comparison: None Available.

CLINICAL DATA: Dizziness.  Vertigo.

EXAM:
MRI HEAD WITHOUT CONTRAST
TECHNIQUE: Multiplanar, multiecho pulse sequences of the brain and surrounding
structures were obtained without intravenous contrast.

[Series 5: DWI · axial · 4.0mm · 0.90mm/px · z∈[-82,+69]mm · 7 of 60 slices shown (1 of 2)]
[im 1/60]
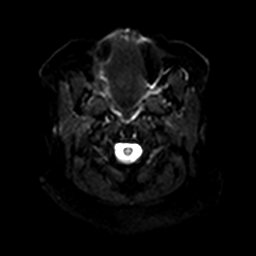
[im 10/60]
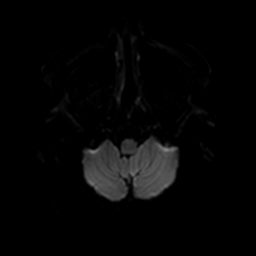
[im 20/60]
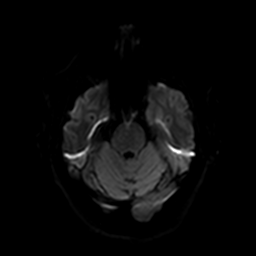
[im 30/60]
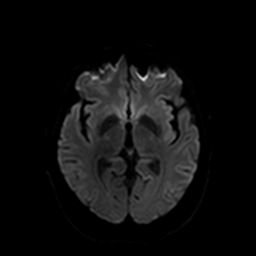
[im 40/60]
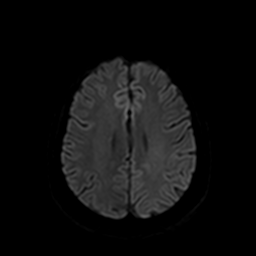
[im 50/60]
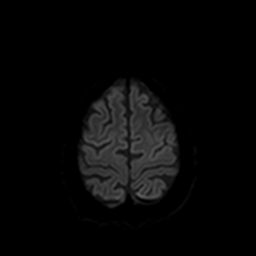
[im 60/60]
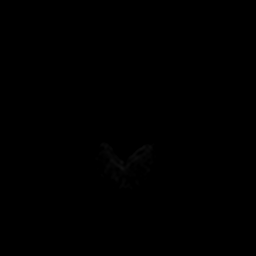

[Series 6: DWI · axial · 4.0mm · 0.90mm/px · z∈[-82,+69]mm · 4 of 30 slices shown (2 of 2)]
[im 1/30]
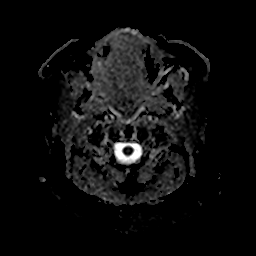
[im 10/30]
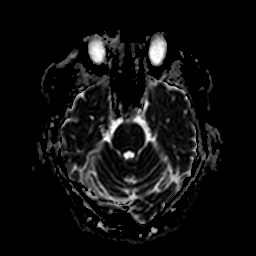
[im 20/30]
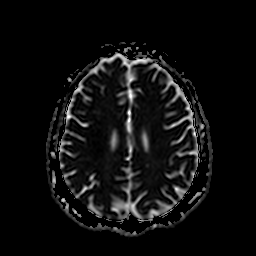
[im 30/30]
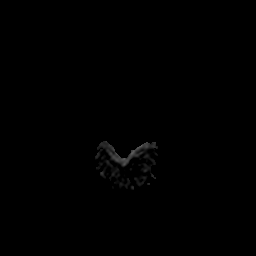

[Series 7: T1 · sagittal · 4.0mm · 0.86mm/px · 4 of 27 slices shown (1 of 2)]
[im 1/27]
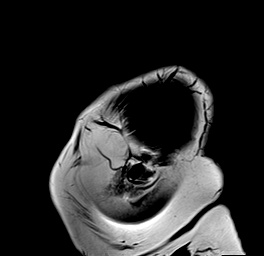
[im 9/27]
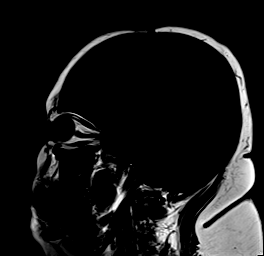
[im 18/27]
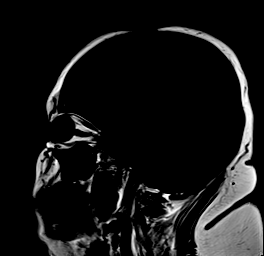
[im 27/27]
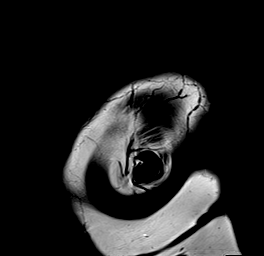

[Series 8: T2 · axial · 4.0mm · 0.69mm/px · z∈[-85,+71]mm · 4 of 31 slices shown (1 of 2)]
[im 1/31]
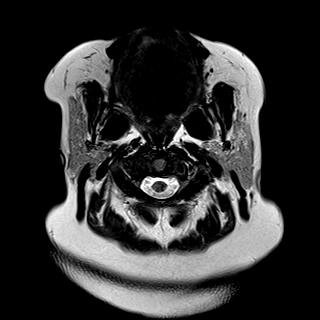
[im 11/31]
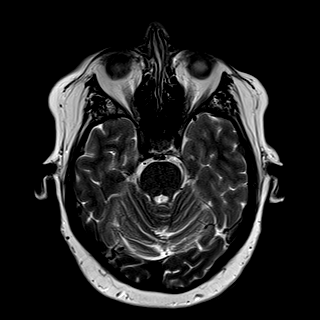
[im 21/31]
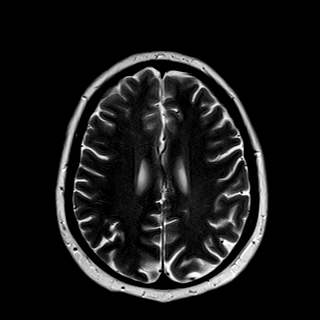
[im 31/31]
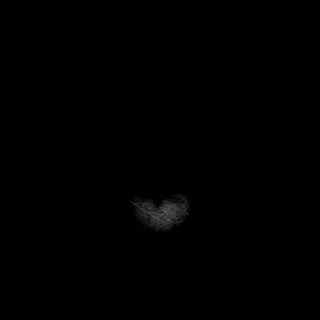

[Series 9: FLAIR · axial · 3.0mm · 0.69mm/px · z∈[-84,+72]mm · 4 of 27 slices shown]
[im 1/27]
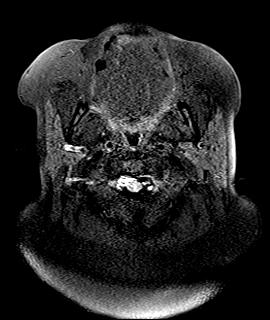
[im 9/27]
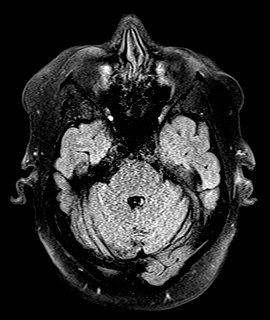
[im 18/27]
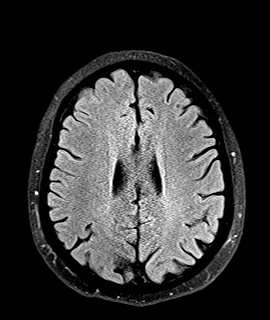
[im 27/27]
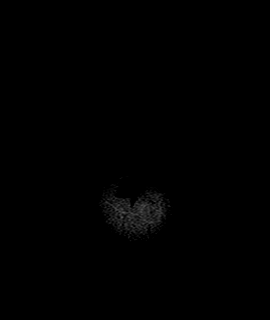

[Series 10: T1 · axial · 1.0mm · 0.98mm/px · z∈[-85,+74]mm · 21 of 160 slices shown (2 of 2)]
[im 1/160]
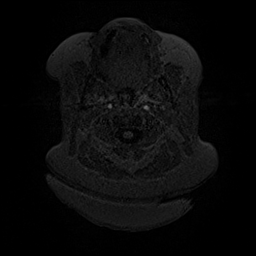
[im 8/160]
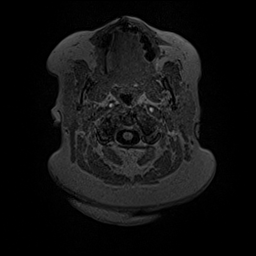
[im 16/160]
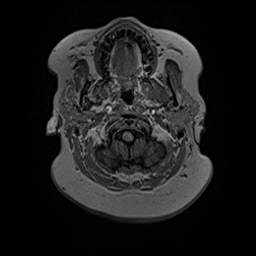
[im 24/160]
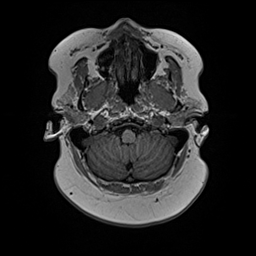
[im 32/160]
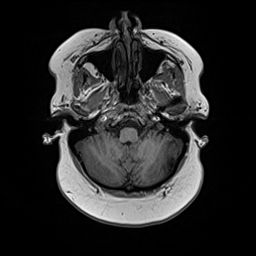
[im 40/160]
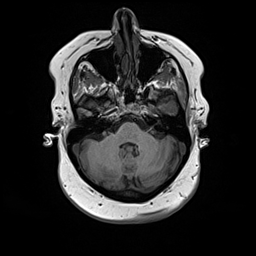
[im 48/160]
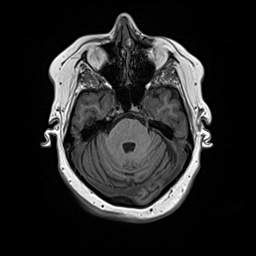
[im 56/160]
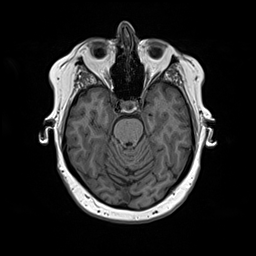
[im 64/160]
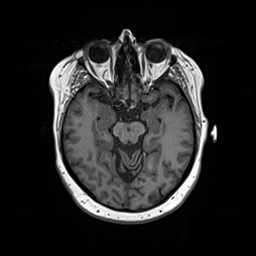
[im 72/160]
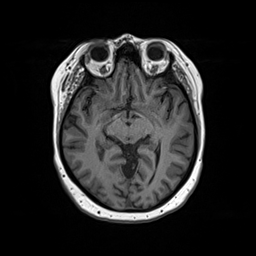
[im 80/160]
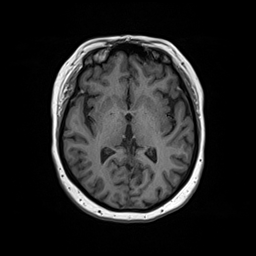
[im 88/160]
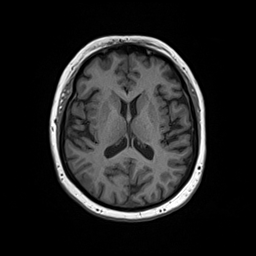
[im 96/160]
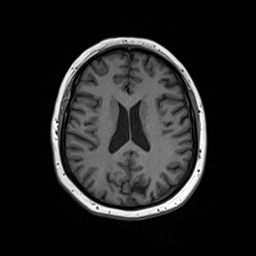
[im 104/160]
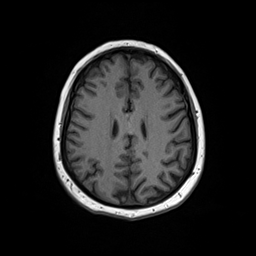
[im 112/160]
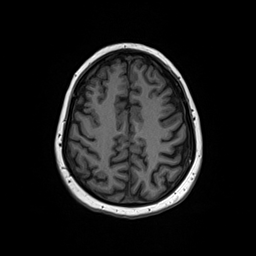
[im 120/160]
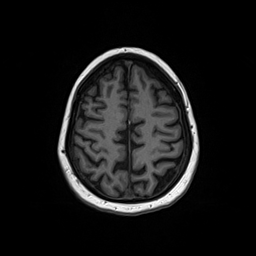
[im 128/160]
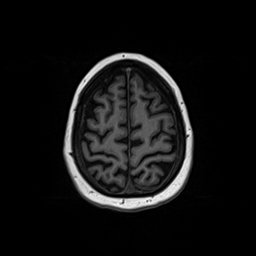
[im 136/160]
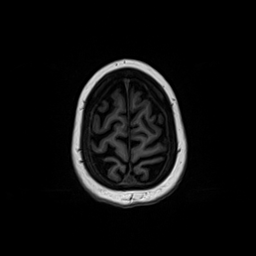
[im 144/160]
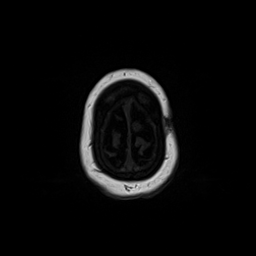
[im 152/160]
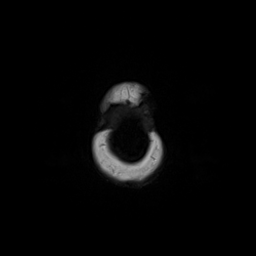
[im 160/160]
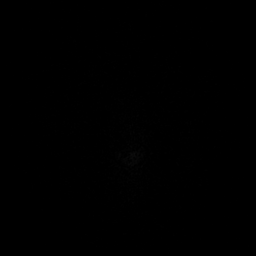

[Series 11: T2 · coronal · 4.0mm · 0.69mm/px · 4 of 32 slices shown (2 of 2)]
[im 1/32]
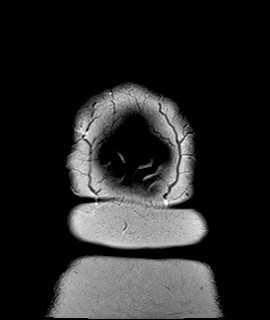
[im 11/32]
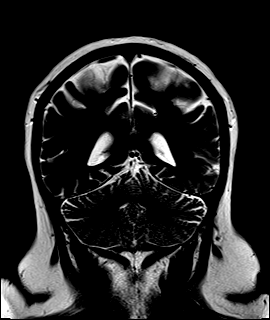
[im 21/32]
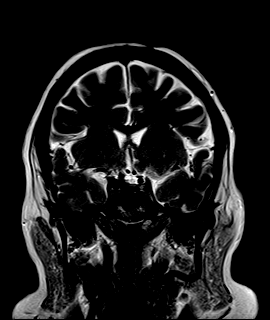
[im 32/32]
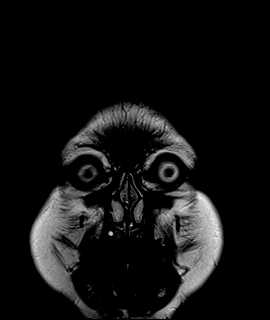

[48 of 48 positions shown; findings below may reference images not displayed]

FINDINGS: Brain: No acute infarction, hemorrhage, hydrocephalus, extra-axial
collection or mass lesion.

Vascular: Major arterial flow voids are maintained at the skull
base.

Skull and upper cervical spine: Normal marrow signal.

Sinuses/Orbits: Clear sinuses.  No acute orbital findings.

Other: No mastoid effusions.
IMPRESSION: No evidence of acute intracranial abnormality.
